# Patient Record
Sex: Male | Born: 1960 | Race: White | Hispanic: No | Marital: Married | Smoking: Never smoker
Health system: Southern US, Community
[De-identification: ages and names within clinical notes are randomized; demographics above are authoritative.]

## PROBLEM LIST (undated history)

## (undated) HISTORY — PX: APPENDECTOMY: SHX54

---

## 2015-11-02 ENCOUNTER — Ambulatory Visit (INDEPENDENT_AMBULATORY_CARE_PROVIDER_SITE_OTHER): Payer: BLUE CROSS/BLUE SHIELD | Admitting: Family Medicine

## 2015-11-02 VITALS — BP 150/80 | HR 73 | Temp 98.1°F | Resp 16 | Ht 67.0 in | Wt 232.0 lb

## 2015-11-02 DIAGNOSIS — Z125 Encounter for screening for malignant neoplasm of prostate: Secondary | ICD-10-CM

## 2015-11-02 DIAGNOSIS — R635 Abnormal weight gain: Secondary | ICD-10-CM

## 2015-11-02 DIAGNOSIS — N528 Other male erectile dysfunction: Secondary | ICD-10-CM

## 2015-11-02 DIAGNOSIS — J0110 Acute frontal sinusitis, unspecified: Secondary | ICD-10-CM

## 2015-11-02 MED ORDER — CEFDINIR 300 MG PO CAPS: 300.0000 mg | ORAL_CAPSULE | Freq: Two times a day (BID) | ORAL | Status: DC

## 2015-11-02 NOTE — Progress Notes (Signed)
[facility] [address], [place] [id number] [phone]  Date:  11/02/2015   Name:  Matthew Moyer   DOB:  [birth year]   MRN:  [id number]  PCP:  No PCP Per Patient    Chief Complaint: Nasal Congestion and chest congestion   History of Present Illness:  Matthew Moyer is a 54 y.o. very pleasant male patient who presents with the following:  Here today with illness- he also suspects that he could have low testosterone and would like to have this checked [facility] and notes that recently he is feeling more tired.   He has noticed this for 2-3 months, but has not had a great energy level in a while Also he is gaining some weight.  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  He has worked nights for about 6 years.  In the past working nights did not bother him.   He is not SA as his wife has not been sexually receptive to him in about 10 years.  This is difficult for him but he is not sure what he could do about it.     He does not have much in the way of AM erection either- he has noted this for a couple of year He has never had a T check in the past  He also has noted a cold for about 10 days- he has noticed head and chest congestion, he is coughing a lot, he has some post nasal drip.  He does use a neti pot.   He did notice a fever a few days ago.  This is now resolved.  However overall his sx are still persistent His cough can be productive.    He does snore some at night- he is not interested in a sleep study as this is not covered by his insurance.  His wife does not notice that he stops breathing while asleep     There are no active problems to display for this patient.   History reviewed. No pertinent past medical history.  Past Surgical History  Procedure Laterality Date  . Appendectomy      Social  History  Substance Use Topics  . Smoking status: Never Smoker   . Smokeless tobacco: Never Used  . Alcohol Use: No    Family History  Problem Relation Age of Onset  . Hypertension Mother     No Known Allergies  Medication list has been reviewed and updated.  No current outpatient prescriptions on file prior to visit.   No current facility-administered medications on file prior to visit.    Review of Systems:  As per HPI- otherwise negative.   Physical Examination: Filed Vitals:   11/02/15 0957  BP: 150/80  Pulse: 73  Temp: 98.1 F (36.7 C)  Resp: 16   Filed Vitals:   11/02/15 0957  Height: 5\' 7"  (1.702 m)  Weight: 232 lb (105.235 kg)   Body mass index is 36.33 kg/(m^2). Ideal Body Weight: Weight in (lb) to have BMI = 25: 159.3  GEN: WDWN, NAD, Non-toxic, A & O x 3, obese, looks well HEENT: Atraumatic, Normocephalic. Neck supple. No masses, No LAD.  Bilateral TM wnl, oropharynx normal.  PEERL,EOMI.   Ears and Nose: No external deformity. CV: RRR, No M/G/R. No JVD. No thrill. No  extra heart sounds. PULM: CTA B, no wheezes, crackles, rhonchi. No retractions. No resp. distress. No accessory muscle use. EXTR: No c/c/e NEURO Normal gait.  PSYCH: Normally interactive. Conversant. Not depressed or anxious appearing.  Calm demeanor.   Wt Readings from Last 3 Encounters:  11/02/15 232 lb (105.235 kg)    Assessment and Plan: Acute frontal sinusitis, recurrence not specified - Plan: cefdinir (OMNICEF) 300 MG capsule  Weight gain - Plan: Comprehensive metabolic panel, TSH, Lipid panel, Hemoglobin A1c, Testosterone, CBC  Other male erectile dysfunction - Plan: Testosterone  Screening for prostate cancer - Plan: PSA  Treat illness with omnicef Placed a future lab order as it is nearly noon now and he is not fasting.  He will come in for am fasting labs soon to check issues above  Signed Abbe Amsterdam, MD

## 2015-11-02 NOTE — Patient Instructions (Signed)
Please come in as close to 8 am as possible in the next few days for lab work.   Avoid eating for 8 hours prior to your labs- also no sugar containing drinks! Water,  Black coffee and diet drinks are ok  We will check your testosterone level, thyroid, cholesterol and screen for diabetes  For your current illness please take the omnicef antibiotic as directed  You are right that your symptoms may signify low testosterone- although other causes are also possible.  We will find out and then proceed accordingly.  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  If there is any way to get onto day shift I would encourage it, but I understand that we all need our jobs!

## 2015-11-06 ENCOUNTER — Telehealth: Payer: Self-pay | Admitting: Family Medicine

## 2015-11-06 MED ORDER — BENZONATATE 100 MG PO CAPS: 100.0000 mg | ORAL_CAPSULE | Freq: Three times a day (TID) | ORAL | Status: DC | PRN

## 2015-11-06 MED ORDER — HYDROCODONE-HOMATROPINE 5-1.5 MG/5ML PO SYRP: 5.0000 mL | ORAL_SOLUTION | Freq: Every evening | ORAL | Status: DC | PRN

## 2015-11-06 NOTE — Telephone Encounter (Signed)
Patient was seen on Monday with Dr. Patsy Lageropland. He was given Omnicef. He is coughing and he has been having difficulty taking a deep breath without coughing. He is asking if he could get a cough medicine.

## 2015-11-06 NOTE — Telephone Encounter (Signed)
I will provide patient with Hycodan to take at bedtime and Tessalon to take during the day. RTC if no improvement in 5 days.

## 2015-11-09 NOTE — Telephone Encounter (Signed)
Pt advised.

## 2015-11-11 ENCOUNTER — Telehealth: Payer: Self-pay

## 2015-11-11 MED ORDER — BENZONATATE 100 MG PO CAPS: 100.0000 mg | ORAL_CAPSULE | Freq: Three times a day (TID) | ORAL | Status: DC | PRN

## 2015-11-11 NOTE — Telephone Encounter (Signed)
Called him back but his phone "is not accepting calls at this time.'  Will RF tesslaon

## 2015-11-11 NOTE — Telephone Encounter (Signed)
Dr. Patsy Lageropland did you want him to come back in?

## 2015-11-11 NOTE — Telephone Encounter (Signed)
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  [phone]

## 2015-11-17 ENCOUNTER — Telehealth: Payer: Self-pay

## 2015-11-17 NOTE — Telephone Encounter (Signed)
Spoke with pt, advised to come back in. Pt refused, stating his cough is still there and gets worse at night.

## 2015-11-17 NOTE — Telephone Encounter (Signed)
Called him back- he actually does not want any more of the hydrocodone but plans to buy some OTC delsym

## 2015-11-17 NOTE — Telephone Encounter (Signed)
Pt is needing a refill for his cough medication   Best number [phone] or [phone]

## 2015-12-07 ENCOUNTER — Ambulatory Visit (INDEPENDENT_AMBULATORY_CARE_PROVIDER_SITE_OTHER): Payer: BLUE CROSS/BLUE SHIELD

## 2015-12-07 ENCOUNTER — Ambulatory Visit (INDEPENDENT_AMBULATORY_CARE_PROVIDER_SITE_OTHER): Payer: BLUE CROSS/BLUE SHIELD | Admitting: Family Medicine

## 2015-12-07 VITALS — BP 152/100 | HR 82 | Temp 98.0°F | Resp 17 | Ht 67.0 in | Wt 246.0 lb

## 2015-12-07 DIAGNOSIS — R0989 Other specified symptoms and signs involving the circulatory and respiratory systems: Secondary | ICD-10-CM | POA: Diagnosis not present

## 2015-12-07 DIAGNOSIS — R3129 Other microscopic hematuria: Secondary | ICD-10-CM | POA: Diagnosis not present

## 2015-12-07 DIAGNOSIS — R600 Localized edema: Secondary | ICD-10-CM

## 2015-12-07 DIAGNOSIS — R945 Abnormal results of liver function studies: Secondary | ICD-10-CM

## 2015-12-07 DIAGNOSIS — R7989 Other specified abnormal findings of blood chemistry: Secondary | ICD-10-CM | POA: Diagnosis not present

## 2015-12-07 DIAGNOSIS — R7309 Other abnormal glucose: Secondary | ICD-10-CM

## 2015-12-07 LAB — CBC
HCT: 47.1 % (ref 39.0–52.0)
Hemoglobin: 16.2 g/dL (ref 13.0–17.0)
MCH: 30.9 pg (ref 26.0–34.0)
MCHC: 34.4 g/dL (ref 30.0–36.0)
MCV: 89.7 fL (ref 78.0–100.0)
MPV: 9.9 fL (ref 8.6–12.4)
PLATELETS: 403 10*3/uL — AB (ref 150–400)
RBC: 5.25 MIL/uL (ref 4.22–5.81)
RDW: 13.7 % (ref 11.5–15.5)
WBC: 9.4 10*3/uL (ref 4.0–10.5)

## 2015-12-07 LAB — COMPREHENSIVE METABOLIC PANEL
ALBUMIN: 4.1 g/dL (ref 3.6–5.1)
ALT: 65 U/L — ABNORMAL HIGH (ref 9–46)
AST: 51 U/L — AB (ref 10–35)
Alkaline Phosphatase: 64 U/L (ref 40–115)
BILIRUBIN TOTAL: 0.6 mg/dL (ref 0.2–1.2)
BUN: 12 mg/dL (ref 7–25)
CO2: 30 mmol/L (ref 20–31)
CREATININE: 0.98 mg/dL (ref 0.70–1.33)
Calcium: 9.5 mg/dL (ref 8.6–10.3)
Chloride: 98 mmol/L (ref 98–110)
Glucose, Bld: 124 mg/dL — ABNORMAL HIGH (ref 65–99)
Potassium: 4.3 mmol/L (ref 3.5–5.3)
SODIUM: 136 mmol/L (ref 135–146)
Total Protein: 7.2 g/dL (ref 6.1–8.1)

## 2015-12-07 LAB — POCT URINALYSIS DIP (MANUAL ENTRY)
Bilirubin, UA: NEGATIVE
GLUCOSE UA: NEGATIVE
Ketones, POC UA: NEGATIVE
Leukocytes, UA: NEGATIVE
NITRITE UA: NEGATIVE
PROTEIN UA: NEGATIVE
Spec Grav, UA: 1.015
UROBILINOGEN UA: 0.2
pH, UA: 8

## 2015-12-07 LAB — CK: Total CK: 275 U/L — ABNORMAL HIGH (ref 7–232)

## 2015-12-07 MED ORDER — FUROSEMIDE 20 MG PO TABS: ORAL_TABLET | ORAL | Status: AC

## 2015-12-07 NOTE — Patient Instructions (Addendum)
Because you received an x-ray today, you will receive an invoice from [facility]. Please contact [facility] at [phone] with questions or concerns regarding your invoice. Our billing staff will not be able to assist you with those questions.   Your chest x-ray looks clear. I think that the swelling in your legs is due to a side effect of the testosterone.   Wait until I call you with your labs today- I want to make sure that your kidneys look ok. Assuming that they do, I will have you use some of the lasix medication to get rid of the extra fluid.    Please wear compression socks- put them on in the morning- and wear them until you go to bed at night to help with the swelling No more testosterone!

## 2015-12-07 NOTE — Progress Notes (Signed)
[facility] [address] [place] [address] [phone]  Date:  12/07/2015   Name:  Matthew Moyer   DOB:  Nov 18, 1960   MRN:  [id number]  PCP:  No PCP Per Patient    Chief Complaint: Leg Swelling   History of Present Illness:  Matthew Moyer is a 55 y.o. very pleasant male patient who presents with the following:  Generally healthy man here today with complaint of leg swelling.  At our last visit in December he had complained of ED and we planned to check future labs- testosterone, PSA, etc.   In the meantime he did not come in for labs but admits he has been using testosterone on his own . . .   Has since stopped, but he was taking a shot of IM testosterone every 4 days for 5 doses total= he got this from someone at the gym.  He shows me a photo of a box of injectable testosterone from [place].  We are unsure of the exact dose he was taking.  About 20 days into this regimen he noted onset of bilateral leg swelling. He quit using the testosterone but the swelling has not yet gone away.  He first noted the swelling about 2 weeks ago now He does not have any CP or SOB, no orthopnea Never had this in the past  No history of CHF He otherwise feels well  He denies ever using any type of steroid in the past There are no active problems to display for this patient.   No past medical history on file.  Past Surgical History  Procedure Laterality Date  . Appendectomy      Social History  Substance Use Topics  . Smoking status: Never Smoker   . Smokeless tobacco: Never Used  . Alcohol Use: No    Family History  Problem Relation Age of Onset  . Hypertension Mother     No Known Allergies  Medication list has been reviewed and updated.  Current Outpatient Prescriptions on File Prior to Visit  Medication Sig Dispense Refill  . b complex vitamins tablet Take 1 tablet by mouth daily.    . Cholecalciferol (VITAMIN D PO) Take by mouth.    . Magnesium  Gluconate (MAGNESIUM 27 PO) Take by mouth.    Marland Kitchen POTASSIUM AMINOBENZOATE PO Take by mouth.    . pseudoephedrine (SUDAFED) 120 MG 12 hr tablet Take 120 mg by mouth 2 (two) times daily.    Marland Kitchen VITAMIN A PO Take by mouth.    . Dextrose, Diabetic Use, (GLUCOSE PO) Take by mouth. Reported on 12/07/2015     No current facility-administered medications on file prior to visit.    Review of Systems:  As per HPI- otherwise negative.   Physical Examination: Filed Vitals:   12/07/15 0811  BP: 164/102  Pulse: 82  Temp: 98 F (36.7 C)  Resp: 17   Filed Vitals:   12/07/15 0811  Height:  (1.702 m)  Weight: 246 lb (111.585 kg)   Body mass index is 38.52 kg/(m^2). Ideal Body Weight: Weight in (lb) to have BMI = 25: 159.3  GEN: WDWN, NAD, Non-toxic, A & O x 3, muscular build, overweight HEENT: Atraumatic, Normocephalic. Neck supple. No masses, No LAD. Ears and Nose: No external deformity. CV: RRR, No M/G/R. No JVD. No thrill. No extra heart sounds. PULM: CTA B, no wheezes, crackles, rhonchi. No retractions. No resp. distress. No accessory muscle use. EXTR: No c/c.  He has 1-2+ non- pitting edema of the bilateral LE to the mid tibia NEURO Normal gait.  PSYCH: Normally interactive. Conversant. Not depressed or anxious appearing.  Calm demeanor.   [facility] reading (PRIMARY) by  Dr. Patsy Lager. CXR: negative  Dg Chest 2 View  12/07/2015  CLINICAL DATA:  Swollen leg. EXAM: CHEST  2 VIEW COMPARISON:  No prior. FINDINGS: Mediastinum hilar structures are normal. Low lung volumes with mild bibasilar atelectasis. No pleural effusion or pneumothorax. Heart size normal. No acute bony abnormality . IMPRESSION: Low lung volumes with mild basilar atelectasis, otherwise negative chest . Electronically Signed   By: Maisie Fus  Register   On: 12/07/2015 09:06   Results for orders placed or performed in visit on 12/07/15  Comprehensive metabolic panel  Result Value Ref Range   Sodium 136 135 - 146 mmol/L    Potassium 4.3 3.5 - 5.3 mmol/L   Chloride 98 98 - 110 mmol/L   CO2 30 20 - 31 mmol/L   Glucose, Bld 124 (H) 65 - 99 mg/dL   BUN 12 7 - 25 mg/dL   Creat 1.61 0.96 - 0.45 mg/dL   Total Bilirubin 0.6 0.2 - 1.2 mg/dL   Alkaline Phosphatase 64 40 - 115 U/L   AST 51 (H) 10 - 35 U/L   ALT 65 (H) 9 - 46 U/L   Total Protein 7.2 6.1 - 8.1 g/dL   Albumin 4.1 3.6 - 5.1 g/dL   Calcium 9.5 8.6 - 40.9 mg/dL  CK  Result Value Ref Range   Total CK 275 (H) 7 - 232 U/L  CBC  Result Value Ref Range   WBC 9.4 4.0 - 10.5 K/uL   RBC 5.25 4.22 - 5.81 MIL/uL   Hemoglobin 16.2 13.0 - 17.0 g/dL   HCT 81.1 91.4 - 78.2 %   MCV 89.7 78.0 - 100.0 fL   MCH 30.9 26.0 - 34.0 pg   MCHC 34.4 30.0 - 36.0 g/dL   RDW 95.6 21.3 - 08.6 %   Platelets 403 (H) 150 - 400 K/uL   MPV 9.9 8.6 - 12.4 fL  POCT urinalysis dipstick  Result Value Ref Range   Color, UA yellow yellow   Clarity, UA clear clear   Glucose, UA negative negative   Bilirubin, UA negative negative   Ketones, POC UA negative negative   Spec Grav, UA 1.015    Blood, UA trace-intact (A) negative   pH, UA 8.0    Protein Ur, POC negative negative   Urobilinogen, UA 0.2    Nitrite, UA Negative Negative   Leukocytes, UA Negative Negative   Assessment and Plan: Bilateral leg edema - Plan: POCT urinalysis dipstick, Comprehensive metabolic panel, CK, CBC, DG Chest 2 View, furosemide (LASIX) 20 MG tablet  Lung crackles - Plan: DG Chest 2 View  Here today with LE edema which may be related to use of illicit testosterone injections.  He has stopped using this and states he will not do this again.   Will check labs and assuming renal function is ok will use lasix to treat his edema  Signed Abbe Amsterdam, MD Called and LMOM approx 5 pm when labs came in- take 20 mg of lasix today, then take 20 BID tomorrow.  Will touch base with him tomorrow to see how he is doing   Spoke with pt on 1/31.  He is doing much better, his leg edema is nearly resolved.  He  will take one more dose of lasix tomorrow and then  stop assuming his swelling is gone or nearly so Advised him that he had trace blood on his UA and borderline glucose, elevated liver function Placed future orders for him and he will come in for repeat labs in 1-2 weeks

## 2015-12-14 ENCOUNTER — Other Ambulatory Visit (INDEPENDENT_AMBULATORY_CARE_PROVIDER_SITE_OTHER): Payer: BLUE CROSS/BLUE SHIELD | Admitting: *Deleted

## 2015-12-14 DIAGNOSIS — N528 Other male erectile dysfunction: Secondary | ICD-10-CM

## 2015-12-14 DIAGNOSIS — R3129 Other microscopic hematuria: Secondary | ICD-10-CM

## 2015-12-14 DIAGNOSIS — R7309 Other abnormal glucose: Secondary | ICD-10-CM

## 2015-12-14 DIAGNOSIS — Z125 Encounter for screening for malignant neoplasm of prostate: Secondary | ICD-10-CM

## 2015-12-14 DIAGNOSIS — R945 Abnormal results of liver function studies: Secondary | ICD-10-CM

## 2015-12-14 DIAGNOSIS — R635 Abnormal weight gain: Secondary | ICD-10-CM

## 2015-12-14 DIAGNOSIS — R7989 Other specified abnormal findings of blood chemistry: Secondary | ICD-10-CM | POA: Diagnosis not present

## 2015-12-14 LAB — CBC
HCT: 48.5 % (ref 39.0–52.0)
Hemoglobin: 16.5 g/dL (ref 13.0–17.0)
MCH: 30.3 pg (ref 26.0–34.0)
MCHC: 34 g/dL (ref 30.0–36.0)
MCV: 89 fL (ref 78.0–100.0)
MPV: 10.1 fL (ref 8.6–12.4)
PLATELETS: 333 10*3/uL (ref 150–400)
RBC: 5.45 MIL/uL (ref 4.22–5.81)
RDW: 13.5 % (ref 11.5–15.5)
WBC: 7.1 10*3/uL (ref 4.0–10.5)

## 2015-12-14 LAB — COMPREHENSIVE METABOLIC PANEL
ALBUMIN: 4.3 g/dL (ref 3.6–5.1)
ALT: 55 U/L — AB (ref 9–46)
AST: 38 U/L — ABNORMAL HIGH (ref 10–35)
Alkaline Phosphatase: 74 U/L (ref 40–115)
BUN: 17 mg/dL (ref 7–25)
CHLORIDE: 99 mmol/L (ref 98–110)
CO2: 26 mmol/L (ref 20–31)
Calcium: 9.1 mg/dL (ref 8.6–10.3)
Creat: 0.95 mg/dL (ref 0.70–1.33)
Glucose, Bld: 127 mg/dL — ABNORMAL HIGH (ref 65–99)
POTASSIUM: 4.3 mmol/L (ref 3.5–5.3)
Sodium: 135 mmol/L (ref 135–146)
TOTAL PROTEIN: 7.6 g/dL (ref 6.1–8.1)
Total Bilirubin: 0.7 mg/dL (ref 0.2–1.2)

## 2015-12-14 LAB — LIPID PANEL
CHOL/HDL RATIO: 4.8 ratio (ref <=5.0)
CHOLESTEROL: 172 mg/dL (ref 125–200)
HDL: 36 mg/dL — AB (ref >=40)
LDL Cholesterol: 109 mg/dL (ref <130)
TRIGLYCERIDES: 135 mg/dL (ref <150)
VLDL: 27 mg/dL (ref <30)

## 2015-12-14 LAB — POCT URINALYSIS DIP (MANUAL ENTRY)
Bilirubin, UA: NEGATIVE
Glucose, UA: NEGATIVE
Ketones, POC UA: NEGATIVE
Leukocytes, UA: NEGATIVE
NITRITE UA: NEGATIVE
PH UA: 6
Protein Ur, POC: NEGATIVE
Spec Grav, UA: 1.01
UROBILINOGEN UA: 0.2

## 2015-12-14 LAB — PSA: PSA: 0.86 ng/mL (ref <=4.00)

## 2015-12-14 LAB — POC MICROSCOPIC URINALYSIS (UMFC): Mucus: ABSENT

## 2015-12-14 LAB — TESTOSTERONE: TESTOSTERONE: 244 ng/dL — AB (ref 250–827)

## 2015-12-14 LAB — HEMOGLOBIN A1C
Hgb A1c MFr Bld: 5.7 % — ABNORMAL HIGH (ref <5.7)
Mean Plasma Glucose: 117 mg/dL — ABNORMAL HIGH (ref <117)

## 2015-12-14 LAB — TSH: TSH: 1.43 m[IU]/L (ref 0.40–4.50)

## 2015-12-17 ENCOUNTER — Telehealth: Payer: Self-pay | Admitting: Family Medicine

## 2015-12-17 ENCOUNTER — Encounter: Payer: Self-pay | Admitting: Family Medicine

## 2015-12-17 NOTE — Telephone Encounter (Signed)
Patient called requesting lab results from Monday 02/06. Please advise

## 2015-12-17 NOTE — Telephone Encounter (Signed)
Called him and went over his labs. Overall good- let's recheck his T and liver function in 3-4 months (he was recently using injectable T on his own).  Liver function is trending in the right direction Urine micro is negative for blood Will send him a letter with details in the mail  Results for orders placed or performed in visit on 12/14/15  Comprehensive metabolic panel  Result Value Ref Range   Sodium 135 135 - 146 mmol/L   Potassium 4.3 3.5 - 5.3 mmol/L   Chloride 99 98 - 110 mmol/L   CO2 26 20 - 31 mmol/L   Glucose, Bld 127 (H) 65 - 99 mg/dL   BUN 17 7 - 25 mg/dL   Creat 6.29 5.28 - 4.13 mg/dL   Total Bilirubin 0.7 0.2 - 1.2 mg/dL   Alkaline Phosphatase 74 40 - 115 U/L   AST 38 (H) 10 - 35 U/L   ALT 55 (H) 9 - 46 U/L   Total Protein 7.6 6.1 - 8.1 g/dL   Albumin 4.3 3.6 - 5.1 g/dL   Calcium 9.1 8.6 - 24.4 mg/dL  TSH  Result Value Ref Range   TSH 1.43 0.40 - 4.50 mIU/L  Lipid panel  Result Value Ref Range   Cholesterol 172 125 - 200 mg/dL   Triglycerides 010 <272 mg/dL   HDL 36 (L) >=53 mg/dL   Total CHOL/HDL Ratio 4.8 <=5.0 Ratio   VLDL 27 <30 mg/dL   LDL Cholesterol 664 <403 mg/dL  Hemoglobin K7Q  Result Value Ref Range   Hgb A1c MFr Bld 5.7 (H) <5.7 %   Mean Plasma Glucose 117 (H) <117 mg/dL  Testosterone  Result Value Ref Range   Testosterone 244 (L) 250 - 827 ng/dL  CBC  Result Value Ref Range   WBC 7.1 4.0 - 10.5 K/uL   RBC 5.45 4.22 - 5.81 MIL/uL   Hemoglobin 16.5 13.0 - 17.0 g/dL   HCT 25.9 56.3 - 87.5 %   MCV 89.0 78.0 - 100.0 fL   MCH 30.3 26.0 - 34.0 pg   MCHC 34.0 30.0 - 36.0 g/dL   RDW 64.3 32.9 - 51.8 %   Platelets 333 150 - 400 K/uL   MPV 10.1 8.6 - 12.4 fL  PSA  Result Value Ref Range   PSA 0.86 <=4.00 ng/mL  POCT urinalysis dipstick  Result Value Ref Range   Color, UA yellow yellow   Clarity, UA clear clear   Glucose, UA negative negative   Bilirubin, UA negative negative   Ketones, POC UA negative negative   Spec Grav, UA 1.010    Blood, UA trace-lysed (A) negative   pH, UA 6.0    Protein Ur, POC negative negative   Urobilinogen, UA 0.2    Nitrite, UA Negative Negative   Leukocytes, UA Negative Negative  POCT Microscopic Urinalysis ([facility])  Result Value Ref Range   WBC,UR,HPF,POC None None WBC/hpf   RBC,UR,HPF,POC None None RBC/hpf   Bacteria None None, Too numerous to count   Mucus Absent Absent   Epithelial Cells, UR Per Microscopy None None, Too numerous to count cells/hpf

## 2019-11-08 ENCOUNTER — Encounter (HOSPITAL_COMMUNITY): Payer: Self-pay | Admitting: Emergency Medicine

## 2019-11-08 ENCOUNTER — Emergency Department (HOSPITAL_COMMUNITY)
Admission: EM | Admit: 2019-11-08 | Discharge: 2019-11-08 | Disposition: A | Discharge status: Home / Self Care | Payer: No Typology Code available for payment source | Attending: Emergency Medicine | Admitting: Emergency Medicine

## 2019-11-08 ENCOUNTER — Emergency Department (HOSPITAL_COMMUNITY): Payer: No Typology Code available for payment source

## 2019-11-08 ENCOUNTER — Other Ambulatory Visit: Payer: Self-pay

## 2019-11-08 DIAGNOSIS — Y9389 Activity, other specified: Secondary | ICD-10-CM | POA: Diagnosis not present

## 2019-11-08 DIAGNOSIS — Y92812 Truck as the place of occurrence of the external cause: Secondary | ICD-10-CM | POA: Insufficient documentation

## 2019-11-08 DIAGNOSIS — S93401A Sprain of unspecified ligament of right ankle, initial encounter: Secondary | ICD-10-CM | POA: Diagnosis not present

## 2019-11-08 DIAGNOSIS — S99911A Unspecified injury of right ankle, initial encounter: Secondary | ICD-10-CM | POA: Diagnosis present

## 2019-11-08 DIAGNOSIS — X500XXA Overexertion from strenuous movement or load, initial encounter: Secondary | ICD-10-CM | POA: Insufficient documentation

## 2019-11-08 DIAGNOSIS — Z79899 Other long term (current) drug therapy: Secondary | ICD-10-CM | POA: Insufficient documentation

## 2019-11-08 DIAGNOSIS — Y990 Civilian activity done for income or pay: Secondary | ICD-10-CM | POA: Insufficient documentation

## 2019-11-08 NOTE — Progress Notes (Signed)
Orthopedic Tech Progress Note Patient Details:  Matthew Moyer 01/30/1961 [id number]  Ortho Devices Type of Ortho Device: ASO, Crutches Ortho Device/Splint Location: right Ortho Device/Splint Interventions: Application   Post Interventions Patient Tolerated: Well Instructions Provided: Care of device   [name] 11/08/2019, 8:37 AM

## 2019-11-08 NOTE — Discharge Instructions (Addendum)
Rest ankle as much as possible, weight-bear as tolerated. Apply ice and elevate for 30 minutes 3 times a day. Take an anti-inflammatory as directed.  Follow-up with orthopedics if not improving in 1 week.

## 2019-11-08 NOTE — ED Triage Notes (Signed)
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  Is unable to apply pressure.

## 2019-11-08 NOTE — ED Provider Notes (Signed)
[facility] EMERGENCY DEPARTMENT Provider Note   CSN: [id number] Arrival date & time: 11/08/19  0304     History Chief Complaint  Patient presents with  . Ankle Pain    Matthew Moyer is a 59 y.o. male.  59 year old male presents with complaint of right ankle pain.  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  No prior injuries to this ankle.  No history of gout.  No other complaints or concerns.        History reviewed. No pertinent past medical history.  There are no problems to display for this patient.   Past Surgical History:  Procedure Laterality Date  . APPENDECTOMY         Family History  Problem Relation Age of Onset  . Hypertension Mother     Social History   Tobacco Use  . Smoking status: Never Smoker  . Smokeless tobacco: Never Used  Substance Use Topics  . Alcohol use: No    Alcohol/week: 0.0 standard drinks  . Drug use: No    Home Medications Prior to Admission medications   Medication Sig Start Date End Date Taking? Authorizing Provider  b complex vitamins tablet Take 1 tablet by mouth daily.    [provider]  Cholecalciferol (VITAMIN D PO) Take by mouth.    [provider]  Dextrose, Diabetic Use, (GLUCOSE PO) Take by mouth. Reported on 12/07/2015    [provider]  furosemide (LASIX) 20 MG tablet Take 1 or 2 daily as directed for swelling 12/07/15   Copland, Gay Filler, MD  Magnesium Gluconate (MAGNESIUM 27 PO) Take by mouth.    [provider]  POTASSIUM AMINOBENZOATE PO Take by mouth.    [provider]  pseudoephedrine (SUDAFED) 120 MG 12 hr tablet Take 120 mg by mouth 2 (two) times daily.    [provider]    pyridOXINE (VITAMIN B-6) 100 MG tablet Take 100 mg by mouth daily.    [provider]  VITAMIN A PO Take by mouth.    [provider]    Allergies    Patient has no known allergies.  Review of Systems   Review of Systems  Constitutional: Negative for fever.  Musculoskeletal: Positive for arthralgias and myalgias. Negative for joint swelling.  Skin: Negative for color change, rash and wound.  Allergic/Immunologic: Negative for immunocompromised state.  Neurological: Negative for weakness and numbness.  Hematological: Does not bruise/bleed easily.  Psychiatric/Behavioral: Negative for self-injury.  All other systems reviewed and are negative.   Physical Exam Updated Vital Signs BP 118/90   Pulse 60   Temp 97.6 F (36.4 C) (Oral)   Resp 16   SpO2 98%   Physical Exam Vitals and nursing note reviewed.  Constitutional:      Appearance: Normal appearance.  HENT:     Head: Normocephalic and atraumatic.  Cardiovascular:     Pulses: Normal pulses.  Musculoskeletal:        General: Tenderness present. No swelling or deformity.     Right lower leg: No edema.     Left lower leg: No edema.     Right ankle: Tenderness present. No medial  malleolus, base of 5th metatarsal or proximal fibula tenderness. Normal range of motion. Normal pulse.     Right Achilles Tendon: Normal.       Legs:  Skin:    General: Skin is warm and dry.     Capillary Refill: Capillary refill takes less than 2 seconds.     Findings: No erythema or rash.  Neurological:     General: No focal deficit present.     Mental Status: He is alert and oriented to person, place, and time.     Sensory: No sensory deficit.  Psychiatric:        Behavior: Behavior normal.     ED Results / Procedures / Treatments   Labs (all labs ordered are listed, but only abnormal results are displayed) Labs Reviewed - No data to display  EKG None  Radiology DG Ankle Complete Right  Result Date:  11/08/2019 CLINICAL DATA:  Ankle injury EXAM: RIGHT ANKLE - COMPLETE 3+ VIEW COMPARISON:  None. FINDINGS: No fracture or malalignment. Ankle mortise is symmetric. Mild medial and lateral degenerative change. Small plantar calcaneal spur IMPRESSION: No acute osseous abnormality Electronically Signed   By: Jasmine Pang M.D.   On: 11/08/2019 03:52    Procedures Procedures (including critical care time)  Medications Ordered in ED Medications - No data to display  ED Course  I have reviewed the triage vital signs and the nursing notes.  Pertinent labs & imaging results that were available during my care of the patient were reviewed by me and considered in my medical decision making (see chart for details).  Clinical Course as of Nov 07 826  Fri Nov 08, 2019  2823 59 year old male with medial right ankle pain.  On exam has tenderness posterior to the medial malleolus, states unable to bear weight without pain.  Patient will be placed in an ankle ASO, given crutches to weight-bear as tolerated.  Recommend ice and elevate and treat with NSAID.  Patient prefers herbal remedies, will treat at home accordingly.  Referred to Ortho if not improving after 1 week.   [LM]  [id number] X-ray negative for fracture.   [LM]    Clinical Course User Index [LM] Alden Hipp   MDM Rules/Calculators/A&P                     Final Clinical Impression(s) / ED Diagnoses Final diagnoses:  Sprain of right ankle, unspecified ligament, initial encounter    Rx / DC Orders ED Discharge Orders    None       Jeannie Fend, PA-C 11/08/19 6433    Gwyneth Sprout, MD 11/08/19 1932

## 2021-07-08 IMAGING — DX DG ANKLE COMPLETE 3+V*R*
3 series · 3 of 3 positions shown · non-contrast
Comparison: None.

CLINICAL DATA: Ankle injury

EXAM:
RIGHT ANKLE - COMPLETE 3+ VIEW

[ankle ap]
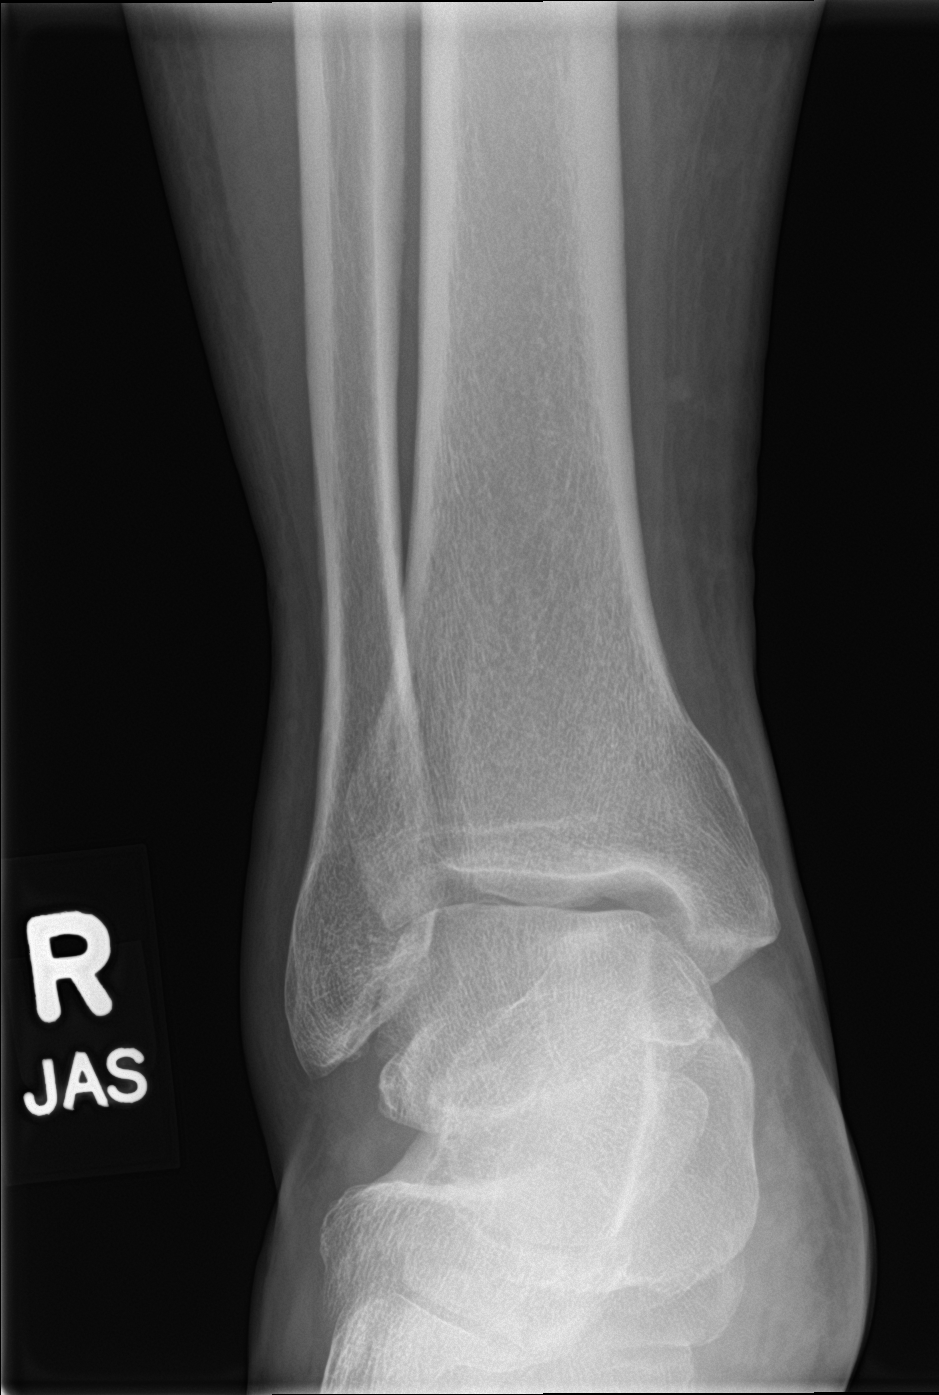

[ankle obl]
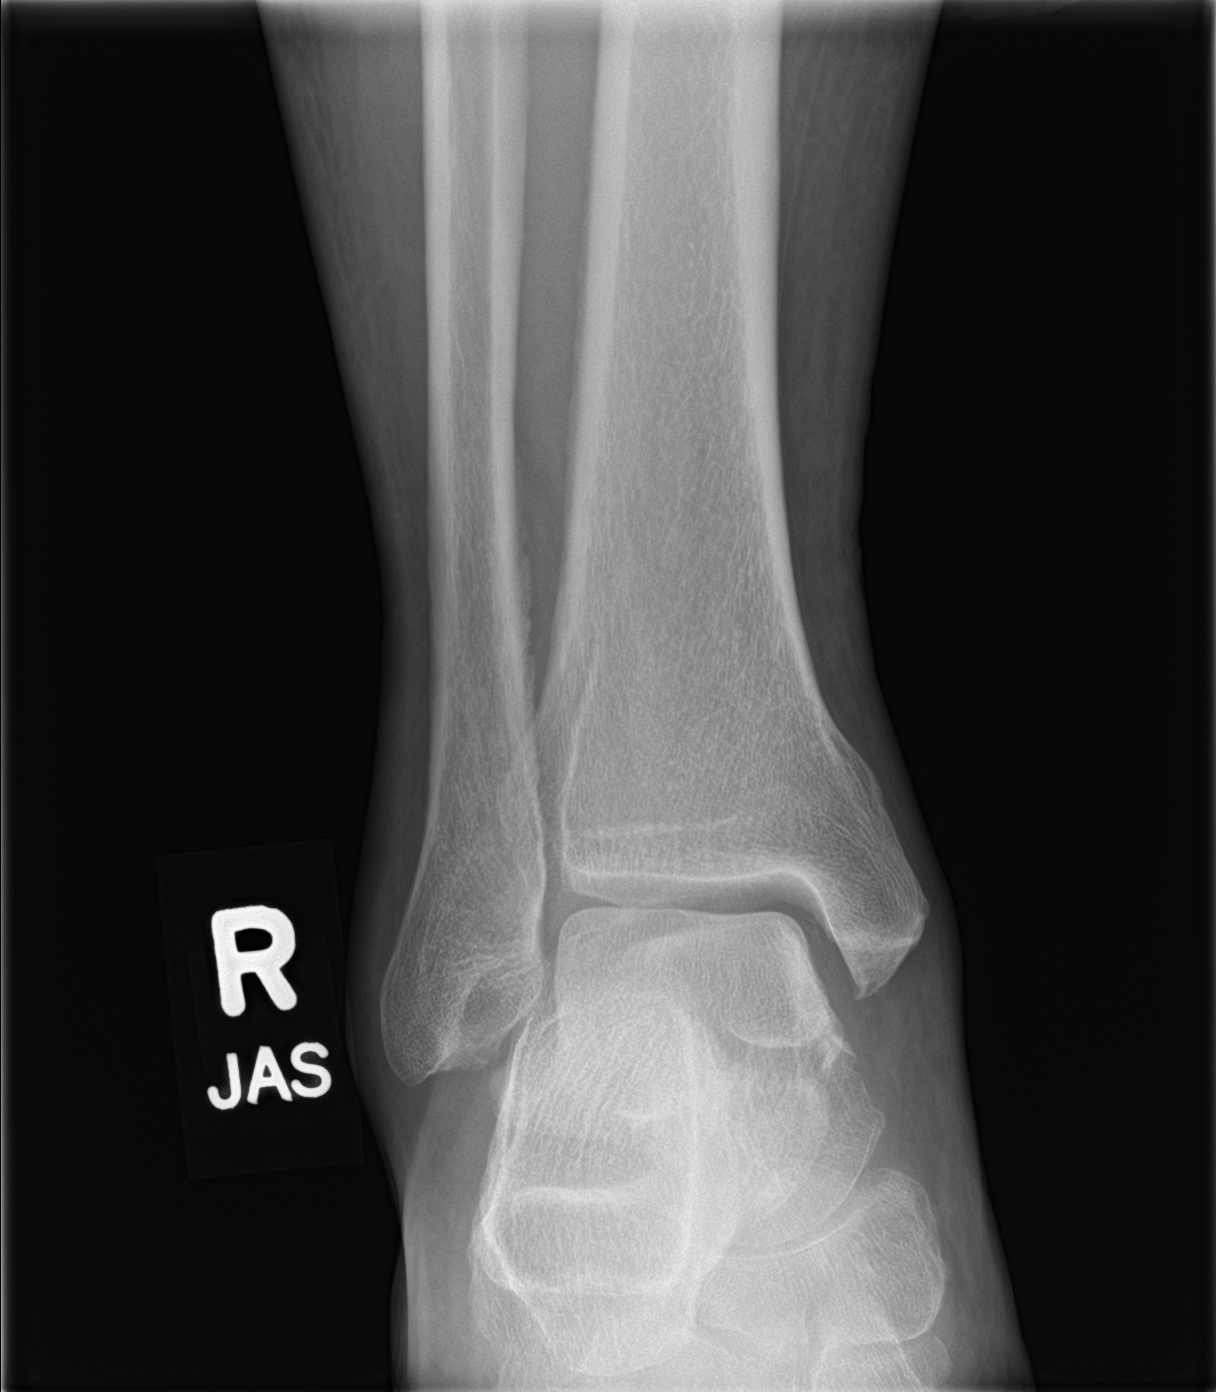

[ankle lat]
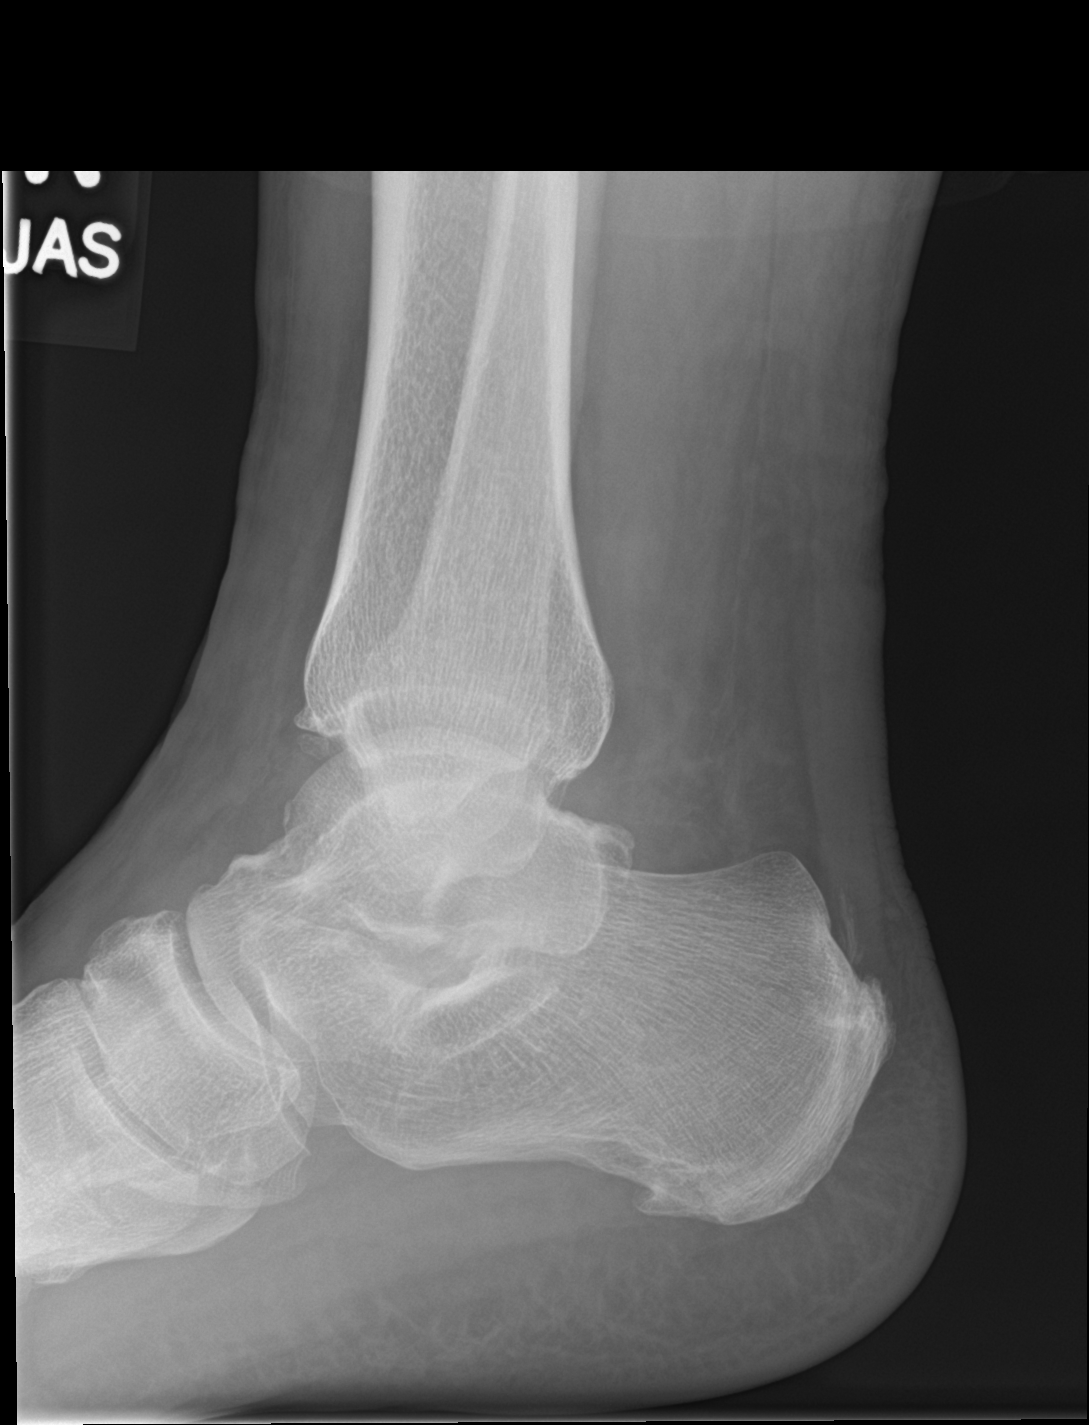

[3 of 3 positions shown; findings below may reference images not displayed]

FINDINGS: No fracture or malalignment. Ankle mortise is symmetric. Mild medial
and lateral degenerative change. Small plantar calcaneal spur
IMPRESSION: No acute osseous abnormality
